# Patient Record
Sex: Female | Born: 1970 | Race: White | Hispanic: No | Marital: Married | Smoking: Never smoker
Health system: Southern US, Community
[De-identification: ages and names within clinical notes are randomized; demographics above are authoritative.]

## PROBLEM LIST (undated history)

## (undated) DIAGNOSIS — I10 Essential (primary) hypertension: Secondary | ICD-10-CM

## (undated) HISTORY — PX: BREAST LUMPECTOMY: SHX2

## (undated) HISTORY — PX: GANGLION CYST EXCISION: SHX1691

## (undated) HISTORY — PX: WRIST SURGERY: SHX841

## (undated) HISTORY — PX: BREAST SURGERY: SHX581

---

## 1997-08-12 ENCOUNTER — Other Ambulatory Visit: Admission: RE | Admit: 1997-08-12 | Discharge: 1997-08-12 | Payer: Self-pay | Admitting: Obstetrics & Gynecology

## 1997-09-09 ENCOUNTER — Other Ambulatory Visit: Admission: RE | Admit: 1997-09-09 | Discharge: 1997-09-09 | Payer: Self-pay | Admitting: Obstetrics & Gynecology

## 1997-09-14 ENCOUNTER — Inpatient Hospital Stay (HOSPITAL_COMMUNITY): Admission: AD | Admit: 1997-09-14 | Discharge: 1997-09-14 | Payer: Self-pay | Admitting: Obstetrics & Gynecology

## 1997-09-16 ENCOUNTER — Inpatient Hospital Stay (HOSPITAL_COMMUNITY): Admission: AD | Admit: 1997-09-16 | Discharge: 1997-09-16 | Payer: Self-pay | Admitting: Obstetrics & Gynecology

## 1997-09-17 ENCOUNTER — Inpatient Hospital Stay (HOSPITAL_COMMUNITY): Admission: AD | Admit: 1997-09-17 | Discharge: 1997-09-20 | Payer: Self-pay | Admitting: Obstetrics and Gynecology

## 1997-10-20 ENCOUNTER — Other Ambulatory Visit: Admission: RE | Admit: 1997-10-20 | Discharge: 1997-10-20 | Payer: Self-pay | Admitting: Obstetrics and Gynecology

## 1999-03-30 ENCOUNTER — Emergency Department (HOSPITAL_COMMUNITY): Admission: EM | Admit: 1999-03-30 | Discharge: 1999-03-30 | Payer: Self-pay | Admitting: Emergency Medicine

## 1999-04-23 ENCOUNTER — Emergency Department (HOSPITAL_COMMUNITY): Admission: EM | Admit: 1999-04-23 | Discharge: 1999-04-23 | Payer: Self-pay | Admitting: Emergency Medicine

## 2000-05-03 ENCOUNTER — Other Ambulatory Visit: Admission: RE | Admit: 2000-05-03 | Discharge: 2000-05-03 | Payer: Self-pay | Admitting: Obstetrics and Gynecology

## 2001-12-17 ENCOUNTER — Other Ambulatory Visit: Admission: RE | Admit: 2001-12-17 | Discharge: 2001-12-17 | Payer: Self-pay | Admitting: *Deleted

## 2003-01-15 ENCOUNTER — Other Ambulatory Visit: Admission: RE | Admit: 2003-01-15 | Discharge: 2003-01-15 | Payer: Self-pay | Admitting: Obstetrics and Gynecology

## 2004-02-25 ENCOUNTER — Other Ambulatory Visit: Admission: RE | Admit: 2004-02-25 | Discharge: 2004-02-25 | Payer: Self-pay | Admitting: Obstetrics and Gynecology

## 2005-05-14 ENCOUNTER — Other Ambulatory Visit: Admission: RE | Admit: 2005-05-14 | Discharge: 2005-05-14 | Payer: Self-pay | Admitting: Obstetrics and Gynecology

## 2006-06-03 ENCOUNTER — Other Ambulatory Visit: Admission: RE | Admit: 2006-06-03 | Discharge: 2006-06-03 | Payer: Self-pay | Admitting: Obstetrics and Gynecology

## 2008-09-30 ENCOUNTER — Ambulatory Visit (HOSPITAL_COMMUNITY): Admission: RE | Admit: 2008-09-30 | Discharge: 2008-09-30 | Payer: Self-pay | Admitting: Obstetrics and Gynecology

## 2008-09-30 ENCOUNTER — Encounter (INDEPENDENT_AMBULATORY_CARE_PROVIDER_SITE_OTHER): Payer: Self-pay | Admitting: Obstetrics and Gynecology

## 2009-09-27 ENCOUNTER — Encounter: Admission: RE | Admit: 2009-09-27 | Discharge: 2009-09-27 | Payer: Self-pay | Admitting: Obstetrics and Gynecology

## 2009-11-03 ENCOUNTER — Inpatient Hospital Stay (HOSPITAL_COMMUNITY): Admission: AD | Admit: 2009-11-03 | Discharge: 2009-11-04 | Payer: Self-pay | Admitting: Obstetrics & Gynecology

## 2009-11-06 ENCOUNTER — Inpatient Hospital Stay (HOSPITAL_COMMUNITY): Admission: AD | Admit: 2009-11-06 | Discharge: 2009-11-09 | Payer: Self-pay | Admitting: Obstetrics and Gynecology

## 2009-11-06 ENCOUNTER — Ambulatory Visit: Payer: Self-pay | Admitting: Nurse Practitioner

## 2010-07-08 LAB — COMPREHENSIVE METABOLIC PANEL
BUN: 11 mg/dL (ref 6–23)
Calcium: 9.3 mg/dL (ref 8.4–10.5)
Glucose, Bld: 73 mg/dL (ref 70–99)
Sodium: 135 mEq/L (ref 135–145)
Total Protein: 6.2 g/dL (ref 6.0–8.3)

## 2010-07-08 LAB — CBC
HCT: 40.8 % (ref 36.0–46.0)
Hemoglobin: 12.7 g/dL (ref 12.0–15.0)
MCH: 26.8 pg (ref 26.0–34.0)
MCH: 27.9 pg (ref 26.0–34.0)
MCHC: 32.5 g/dL (ref 30.0–36.0)
MCHC: 33.5 g/dL (ref 30.0–36.0)
MCHC: 34.4 g/dL (ref 30.0–36.0)
MCV: 81 fL (ref 78.0–100.0)
MCV: 81.3 fL (ref 78.0–100.0)
Platelets: 197 10*3/uL (ref 150–400)
Platelets: 206 10*3/uL (ref 150–400)
RDW: 15 % (ref 11.5–15.5)

## 2010-07-08 LAB — URINALYSIS, ROUTINE W REFLEX MICROSCOPIC
Nitrite: NEGATIVE
Specific Gravity, Urine: >1.03 — ABNORMAL HIGH (ref 1.005–1.030)
Urobilinogen, UA: 0.2 mg/dL (ref 0.0–1.0)

## 2010-07-08 LAB — GLUCOSE, RANDOM: Glucose, Bld: 50 mg/dL — ABNORMAL LOW (ref 70–99)

## 2010-07-08 LAB — URIC ACID: Uric Acid, Serum: 5.4 mg/dL (ref 2.4–7.0)

## 2010-07-08 LAB — URINE MICROSCOPIC-ADD ON

## 2010-07-08 LAB — LACTATE DEHYDROGENASE: LDH: 128 U/L (ref 94–250)

## 2010-07-08 LAB — GLUCOSE, CAPILLARY: Glucose-Capillary: 153 mg/dL — ABNORMAL HIGH (ref 70–99)

## 2010-07-09 LAB — COMPREHENSIVE METABOLIC PANEL
ALT: 17 U/L (ref 0–35)
Alkaline Phosphatase: 113 U/L (ref 39–117)
BUN: 11 mg/dL (ref 6–23)
CO2: 18 mEq/L — ABNORMAL LOW (ref 19–32)
GFR calc non Af Amer: >60 mL/min (ref >60)
Glucose, Bld: 53 mg/dL — ABNORMAL LOW (ref 70–99)
Potassium: 3.4 mEq/L — ABNORMAL LOW (ref 3.5–5.1)
Total Bilirubin: 0.3 mg/dL (ref 0.3–1.2)
Total Protein: 6.7 g/dL (ref 6.0–8.3)

## 2010-07-09 LAB — CBC
HCT: 41.1 % (ref 36.0–46.0)
Hemoglobin: 13.7 g/dL (ref 12.0–15.0)
MCV: 80.3 fL (ref 78.0–100.0)
RDW: 16.1 % — ABNORMAL HIGH (ref 11.5–15.5)
WBC: 10.7 10*3/uL — ABNORMAL HIGH (ref 4.0–10.5)

## 2010-07-09 LAB — PROTEIN, URINE, 24 HOUR: Urine Total Volume-UPROT: 2000 mL

## 2010-07-09 LAB — URIC ACID: Uric Acid, Serum: 5.8 mg/dL (ref 2.4–7.0)

## 2010-07-09 LAB — LACTATE DEHYDROGENASE: LDH: 143 U/L (ref 94–250)

## 2010-07-31 LAB — URINALYSIS, MICROSCOPIC ONLY
Bilirubin Urine: NEGATIVE
Ketones, ur: NEGATIVE mg/dL
Nitrite: NEGATIVE
Urobilinogen, UA: 0.2 mg/dL (ref 0.0–1.0)

## 2010-07-31 LAB — CBC
HCT: 39.6 % (ref 36.0–46.0)
MCHC: 34.5 g/dL (ref 30.0–36.0)
MCV: 88.8 fL (ref 78.0–100.0)
Platelets: 248 10*3/uL (ref 150–400)
RDW: 12.5 % (ref 11.5–15.5)

## 2010-07-31 LAB — ABO/RH: ABO/RH(D): O POS

## 2010-09-05 NOTE — Op Note (Signed)
Danielle Bernard, Danielle Bernard                ACCOUNT NO.:  [id number]   MEDICAL RECORD NO.:  [id number]          PATIENT TYPE:  AMB   LOCATION:  SDC                           FACILITY:  [facility]   PHYSICIAN:  Randye Lobo, M.D.   DATE OF BIRTH:  Aug 22, 1970   DATE OF PROCEDURE:  09/30/2008  DATE OF DISCHARGE:                               OPERATIVE REPORT   PREOPERATIVE DIAGNOSIS:  Missed abortion.   POSTOPERATIVE DIAGNOSIS:  Missed abortion.   PROCEDURE:  Dilation and evacuation.   SURGEON:  Randye Lobo, MD   ANESTHESIA:  MAC, paracervical block with 1% lidocaine.   ESTIMATED BLOOD LOSS:  500 mL.   Urine output by I and O catheterization prior to procedure.   COMPLICATIONS:  None.   INDICATIONS FOR PROCEDURE:  The patient is a 40 year old, gravida 3,  para 2-0-0-2, Caucasian female at 90 plus 5 weeks' gestation by last  menstrual period, who presented to the office for routine ultrasound on  September 29, 2008, at which time she was undergoing an ultrasound for first  trimester screening.  There was noted to have a missed abortion with a  fetus measuring 8 plus 6 weeks' gestation with no cardiac activity.  A  discussion was held with the patient regarding options for care and a  plan was made to proceed with a dilation and evacuation procedure after  risks, benefits, and alternatives were reviewed.   FINDINGS:  Exam under anesthesia revealed a 10-week size mid-position  uterus with no adnexal masses.   SPECIMEN:  Products of conception were sent to Pathology.   PROCEDURE:  The patient was re-identified in the preoperative hold area.  She did receive doxycycline 100 mg p.o. for antibiotic prophylaxis.   In the operating room, MAC anesthesia was induced and the patient was  placed in the dorsal lithotomy position.  The vagina, perineum, and  lower abdomen were sterilely prepped and draped.  The bladder was  catheterized of urine.   An exam under anesthesia was performed.  A speculum  was placed inside  the vagina and a single-tooth tenaculum was placed on the anterior  cervical lip.  A paracervical block was performed with a total of 10 mL  of 1% lidocaine.  The uterus was sounded to 10 cm.  The cervix was then  sequentially dilated to #29 [facility] dilator.  Initially an attempt was  made to pass a #10 suction tip curette, but this was too large and so  this was then switched to a #9 suction tip curette which was placed  without difficulty.  Proper suction was applied and the suction tip  curette was turned in a clockwise fashion as it was removed from within  the uterine cavity.  This was repeated in a couple of additional times  to remove any remaining tissue.  There was some brisk bleeding noted.  Sharp curettage was performed.  No remaining products of conception were  obtained.  The suction tip curette was replaced inside the uterine  cavity and proper suction applied to remove any remaining clots.  The  patient  was given Pitocin 20 units IV.  This did work well to create  improved hemostasis.  A ring forceps was also placed on the cervix and  just inside the endocervical canal.  This applied pressure in the  endocervix which also helped to create good hemostasis.  It was thought  that there was possibly a vessel in the top of the endocervix which was  contributing to some of the bleeding.   The patient was observed in the operating room and hemostasis continued  to be good and the procedure was therefore discontinued.   This concluded the patient's procedure.  There were no complications.  All needle, instrument, and sponge counts were correct.   The patient was escorted to the recovery room in stable and awake  condition.      Randye Lobo, M.D.  Electronically Signed     BES/MEDQ  D:  09/30/2008  T:  09/30/2008  Job:  [id number]

## 2011-01-10 ENCOUNTER — Other Ambulatory Visit: Payer: Self-pay | Admitting: Obstetrics and Gynecology

## 2011-12-23 ENCOUNTER — Encounter (HOSPITAL_COMMUNITY): Payer: Self-pay | Admitting: Emergency Medicine

## 2011-12-23 ENCOUNTER — Emergency Department (HOSPITAL_COMMUNITY)
Admission: EM | Admit: 2011-12-23 | Discharge: 2011-12-23 | Disposition: A | Discharge status: Home / Self Care | Payer: Self-pay | Attending: Emergency Medicine | Admitting: Emergency Medicine

## 2011-12-23 DIAGNOSIS — I10 Essential (primary) hypertension: Secondary | ICD-10-CM | POA: Insufficient documentation

## 2011-12-23 DIAGNOSIS — J029 Acute pharyngitis, unspecified: Secondary | ICD-10-CM

## 2011-12-23 HISTORY — DX: Essential (primary) hypertension: I10

## 2011-12-23 LAB — RAPID STREP SCREEN (MED CTR MEBANE ONLY): Streptococcus, Group A Screen (Direct): NEGATIVE

## 2011-12-23 NOTE — ED Provider Notes (Signed)
Medical screening examination/treatment/procedure(s) were performed by non-physician practitioner and as supervising physician I was immediately available for consultation/collaboration.   Zakir Henner L Merrilyn Legler, MD 12/23/11 2238 

## 2011-12-23 NOTE — ED Notes (Signed)
Pt c/o sore throat and congestion x2 days

## 2011-12-23 NOTE — ED Provider Notes (Signed)
History     CSN: [id number]  Arrival date & time 12/23/11  1614   First MD Initiated Contact with Patient 12/23/11 1726      Chief Complaint  Patient presents with  . Sore Throat  . Nasal Congestion    (Consider location/radiation/quality/duration/timing/severity/associated sxs/prior treatment) HPI Comments: Has mild NP cough.  Patient is a 41 y.o. female presenting with pharyngitis. The history is provided by the patient. No language interpreter was used.  Sore Throat This is a new problem. The current episode started yesterday. The problem occurs constantly. The problem has been unchanged. Associated symptoms include coughing and a sore throat. Pertinent negatives include no chills, fever, headaches, myalgias, nausea, rash, swollen glands or vomiting.    Past Medical History  Diagnosis Date  . Hypertension     Past Surgical History  Procedure Date  . Breast lumpectomy   . Wrist surgery     No family history on file.  History  Substance Use Topics  . Smoking status: Never Smoker   . Smokeless tobacco: Not on file  . Alcohol Use: No    OB History    Grav Para Term Preterm Abortions TAB SAB Ect Mult Living                  Review of Systems  Constitutional: Negative for fever and chills.  HENT: Positive for sore throat.   Respiratory: Positive for cough.   Gastrointestinal: Negative for nausea and vomiting.  Musculoskeletal: Negative for myalgias.  Skin: Negative for rash.  Neurological: Negative for headaches.  All other systems reviewed and are negative.    Allergies  Erythromycin  Home Medications  No current outpatient prescriptions on file.  BP 119/72  Pulse 77  Temp 98.4 F (36.9 C)  Resp 20  Ht 5\' 1"  (1.549 m)  Wt 187 lb (84.823 kg)  BMI 35.33 kg/m2  SpO2 100%  LMP 12/20/2011  Physical Exam  Nursing note and vitals reviewed. Constitutional: She is oriented to person, place, and time. She appears well-developed and well-nourished. No  distress.  HENT:  Head: Normocephalic and atraumatic.  Mouth/Throat: Uvula is midline, oropharynx is clear and moist and mucous membranes are normal. No uvula swelling. No oropharyngeal exudate, posterior oropharyngeal edema, posterior oropharyngeal erythema or tonsillar abscesses.  Eyes: EOM are normal.  Neck: Normal range of motion.  Cardiovascular: Normal rate, regular rhythm and normal heart sounds.   Pulmonary/Chest: Effort normal and breath sounds normal. No accessory muscle usage. Not tachypneic. No respiratory distress. She has no decreased breath sounds. She has no wheezes. She has no rhonchi. She has no rales.  Abdominal: Soft. She exhibits no distension. There is no tenderness.  Musculoskeletal: Normal range of motion.  Neurological: She is alert and oriented to person, place, and time.  Skin: Skin is warm and dry.  Psychiatric: She has a normal mood and affect. Judgment normal.    ED Course  Procedures (including critical care time)   Labs Reviewed  RAPID STREP SCREEN   No results found.   1. Pharyngitis       MDM  Neg strep screen Salt water gargles Chloraseptic Tylenol or ibuprofen F/u PCP        Evalina Field, PA 12/23/11 1830

## 2013-07-16 ENCOUNTER — Other Ambulatory Visit: Payer: Self-pay | Admitting: Obstetrics and Gynecology

## 2014-06-29 ENCOUNTER — Other Ambulatory Visit: Payer: Self-pay | Admitting: Family Medicine

## 2014-06-29 ENCOUNTER — Ambulatory Visit
Admission: RE | Admit: 2014-06-29 | Discharge: 2014-06-29 | Disposition: A | Discharge status: Home / Self Care | Payer: PRIVATE HEALTH INSURANCE | Source: Ambulatory Visit | Attending: Family Medicine | Admitting: Family Medicine

## 2014-06-29 DIAGNOSIS — M25562 Pain in left knee: Principal | ICD-10-CM

## 2014-06-29 DIAGNOSIS — M25561 Pain in right knee: Secondary | ICD-10-CM

## 2014-11-14 ENCOUNTER — Emergency Department (HOSPITAL_COMMUNITY)
Admission: EM | Admit: 2014-11-14 | Discharge: 2014-11-14 | Disposition: A | Discharge status: Home / Self Care | Payer: PRIVATE HEALTH INSURANCE | Attending: Emergency Medicine | Admitting: Emergency Medicine

## 2014-11-14 ENCOUNTER — Encounter (HOSPITAL_COMMUNITY): Payer: Self-pay | Admitting: Emergency Medicine

## 2014-11-14 DIAGNOSIS — M79672 Pain in left foot: Secondary | ICD-10-CM | POA: Diagnosis present

## 2014-11-14 DIAGNOSIS — Z7951 Long term (current) use of inhaled steroids: Secondary | ICD-10-CM | POA: Insufficient documentation

## 2014-11-14 DIAGNOSIS — T814XXA Infection following a procedure, initial encounter: Secondary | ICD-10-CM | POA: Insufficient documentation

## 2014-11-14 DIAGNOSIS — Y838 Other surgical procedures as the cause of abnormal reaction of the patient, or of later complication, without mention of misadventure at the time of the procedure: Secondary | ICD-10-CM | POA: Insufficient documentation

## 2014-11-14 DIAGNOSIS — I10 Essential (primary) hypertension: Secondary | ICD-10-CM | POA: Diagnosis not present

## 2014-11-14 DIAGNOSIS — T798XXA Other early complications of trauma, initial encounter: Secondary | ICD-10-CM

## 2014-11-14 MED ORDER — CLINDAMYCIN HCL 300 MG PO CAPS: 300.0000 mg | ORAL_CAPSULE | Freq: Three times a day (TID) | ORAL | Status: DC

## 2014-11-14 MED ORDER — CLINDAMYCIN HCL 150 MG PO CAPS
300.0000 mg | ORAL_CAPSULE | Freq: Once | ORAL | Status: AC
  Administered 2014-11-14: 300 mg via ORAL
  Filled 2014-11-14: qty 2

## 2014-11-14 MED ORDER — HYDROCODONE-ACETAMINOPHEN 5-325 MG PO TABS: 1.0000 | ORAL_TABLET | ORAL | Status: DC | PRN

## 2014-11-14 NOTE — ED Notes (Signed)
Pt made aware to return if symptoms worsen or if any life threatening symptoms occur.   
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
## 2014-11-14 NOTE — ED Notes (Addendum)
PT c/o pain to left foot after obtaining a laceration to bottom of left foot last Monday with staples still intact. PT states has follow up apt for 11/19/14. Staples intact to left foot with green drainage noted and swelling. PT stated completing an antibiotic that was prescribed from incident.

## 2014-11-14 NOTE — Discharge Instructions (Signed)
Soak foot in warm water 3 times a day, then rinse well, and cover with a light gauze bandage. Return here or see the doctor of your choice as needed for problems.   Wound Infection A wound infection happens when a type of germ (bacteria) starts growing in the wound. In some cases, this can cause the wound to break open. If cared for properly, the infected wound will heal from the inside to the outside. Wound infections need treatment. CAUSES An infection is caused by bacteria growing in the wound.  SYMPTOMS   Increase in redness, swelling, or pain at the wound site.  Increase in drainage at the wound site.  Wound or bandage (dressing) starts to smell bad.  Fever.  Feeling tired or fatigued.  Pus draining from the wound. TREATMENT  Your health care provider will prescribe antibiotic medicine. The wound infection should improve within 24 to 48 hours. Any redness around the wound should stop spreading and the wound should be less painful.  HOME CARE INSTRUCTIONS   Only take over-the-counter or prescription medicines for pain, discomfort, or fever as directed by your health care provider.  Take your antibiotics as directed. Finish them even if you start to feel better.  Gently wash the area with mild soap and water 2 times a day, or as directed. Rinse off the soap. Pat the area dry with a clean towel. Do not rub the wound. This may cause bleeding.  Follow your health care provider's instructions for how often you need to change the dressing.  Apply ointment and a dressing to the wound as directed.  If the dressing sticks, moisten it with soapy water and gently remove it.  Change the bandage right away if it becomes wet, dirty, or develops a bad smell.  Take showers. Do not take tub baths, swim, or do anything that may soak the wound until it is healed.  Avoid exercises that make you sweat heavily.  Use anti-itch medicine as directed by your health care provider. The wound may  itch when it is healing. Do not pick or scratch at the wound.  Follow up with your health care provider to get your wound rechecked as directed. SEEK MEDICAL CARE IF:  You have an increase in swelling, pain, or redness around the wound.  You have an increase in the amount of pus coming from the wound.  There is a bad smell coming from the wound.  More of the wound breaks open.  You have a fever. MAKE SURE YOU:   Understand these instructions.  Will watch your condition.  Will get help right away if you are not doing well or get worse. Document Released: 01/06/2003 Document Revised: 04/14/2013 Document Reviewed: 08/13/2010 [facility] Patient Information 2015 [place], [place]. This information is not intended to replace advice given to you by your health care provider. Make sure you discuss any questions you have with your health care provider.

## 2015-01-06 ENCOUNTER — Other Ambulatory Visit: Payer: Self-pay | Admitting: Obstetrics & Gynecology

## 2015-01-06 DIAGNOSIS — R928 Other abnormal and inconclusive findings on diagnostic imaging of breast: Secondary | ICD-10-CM

## 2015-01-07 ENCOUNTER — Ambulatory Visit
Admission: RE | Admit: 2015-01-07 | Discharge: 2015-01-07 | Disposition: A | Discharge status: Home / Self Care | Payer: PRIVATE HEALTH INSURANCE | Source: Ambulatory Visit | Attending: Obstetrics & Gynecology | Admitting: Obstetrics & Gynecology

## 2015-01-07 DIAGNOSIS — R928 Other abnormal and inconclusive findings on diagnostic imaging of breast: Secondary | ICD-10-CM

## 2015-08-12 ENCOUNTER — Other Ambulatory Visit: Payer: Self-pay | Admitting: Family Medicine

## 2015-08-12 DIAGNOSIS — R1084 Generalized abdominal pain: Secondary | ICD-10-CM

## 2015-08-15 ENCOUNTER — Ambulatory Visit
Admission: RE | Admit: 2015-08-15 | Discharge: 2015-08-15 | Disposition: A | Discharge status: Home / Self Care | Payer: PRIVATE HEALTH INSURANCE | Source: Ambulatory Visit | Attending: Family Medicine | Admitting: Family Medicine

## 2015-08-15 DIAGNOSIS — R1084 Generalized abdominal pain: Secondary | ICD-10-CM

## 2015-10-08 ENCOUNTER — Emergency Department (HOSPITAL_COMMUNITY)
Admission: EM | Admit: 2015-10-08 | Discharge: 2015-10-08 | Disposition: A | Discharge status: Home / Self Care | Payer: PRIVATE HEALTH INSURANCE | Attending: Emergency Medicine | Admitting: Emergency Medicine

## 2015-10-08 ENCOUNTER — Encounter (HOSPITAL_COMMUNITY): Payer: Self-pay | Admitting: Emergency Medicine

## 2015-10-08 DIAGNOSIS — I10 Essential (primary) hypertension: Secondary | ICD-10-CM | POA: Insufficient documentation

## 2015-10-08 DIAGNOSIS — R220 Localized swelling, mass and lump, head: Secondary | ICD-10-CM | POA: Diagnosis present

## 2015-10-08 DIAGNOSIS — B001 Herpesviral vesicular dermatitis: Secondary | ICD-10-CM

## 2015-10-08 DIAGNOSIS — B009 Herpesviral infection, unspecified: Secondary | ICD-10-CM | POA: Insufficient documentation

## 2015-10-08 DIAGNOSIS — Z791 Long term (current) use of non-steroidal anti-inflammatories (NSAID): Secondary | ICD-10-CM | POA: Insufficient documentation

## 2015-10-08 DIAGNOSIS — T148XXA Other injury of unspecified body region, initial encounter: Secondary | ICD-10-CM

## 2015-10-08 DIAGNOSIS — Z79899 Other long term (current) drug therapy: Secondary | ICD-10-CM | POA: Insufficient documentation

## 2015-10-08 DIAGNOSIS — L089 Local infection of the skin and subcutaneous tissue, unspecified: Secondary | ICD-10-CM

## 2015-10-08 MED ORDER — CEPHALEXIN 500 MG PO CAPS: 500.0000 mg | ORAL_CAPSULE | Freq: Four times a day (QID) | ORAL | Status: AC

## 2015-10-08 MED ORDER — VALACYCLOVIR HCL 1 G PO TABS: 2000.0000 mg | ORAL_TABLET | Freq: Two times a day (BID) | ORAL | Status: AC

## 2015-10-08 NOTE — ED Provider Notes (Signed)
CSN: [id number]     Arrival date & time 10/08/15  1753 History   First MD Initiated Contact with Patient 10/08/15 1818     Chief Complaint  Patient presents with  . Facial Swelling     (Consider location/radiation/quality/duration/timing/severity/associated sxs/prior Treatment) HPI  Danielle Bernard is a 45 y.o. female who presents for evaluation of sore on left upper lip which started several days ago, and worsened to include swelling of the lip, and beneath the left mandible. No fever, chills, nausea, vomiting, cough, shortness of breath, or dizziness. Last episode of cold sore was about 3 years ago. There are no other known modifying factors.    Past Medical History  Diagnosis Date  . Hypertension    Past Surgical History  Procedure Laterality Date  . Ganglion cyst excision    . Breast surgery      lump to right breast (beign)   Family History  Problem Relation Age of Onset  . Dementia Mother   . Diabetes Father   . Cancer Father    Social History  Substance Use Topics  . Smoking status: Never Smoker   . Smokeless tobacco: Never Used  . Alcohol Use: No   OB History    Gravida Para Term Preterm AB TAB SAB Ectopic Multiple Living   Review of Systems    Allergies  Erythromycin  Home Medications   Prior to Admission medications   Medication Sig Start Date End Date Taking? Authorizing Provider  acetaminophen (TYLENOL) 500 MG tablet Take 1,000 mg by mouth every 6 (six) hours as needed for mild pain.   Yes Historical Provider, MD  butalbital-acetaminophen-caffeine (FIORICET, ESGIC) 50-325-40 MG tablet Take 1 tablet by mouth every 4 (four) hours as needed for headache or migraine.  09/29/15  Yes Historical Provider, MD  ibuprofen (ADVIL,MOTRIN) 200 MG tablet Take 800 mg by mouth every 6 (six) hours as needed for moderate pain.   Yes Historical Provider, MD  metoprolol succinate (TOPROL-XL) 50 MG 24 hr tablet Take 1 tablet by mouth at bedtime.   11/01/14  Yes Historical Provider, MD  MONONESSA 0.25-35 MG-MCG tablet Take 1 tablet by mouth at bedtime.  09/09/14  Yes Historical Provider, MD  Pediatric Multivitamins-Iron (FLINTSTONES PLUS IRON PO) Take 1 tablet by mouth daily.   Yes Historical Provider, MD  Triamcinolone Acetonide (NASACORT ALLERGY 24HR NA) Place 1 spray into the nose daily.   Yes Historical Provider, MD  cephALEXin (KEFLEX) 500 MG capsule Take 1 capsule (500 mg total) by mouth 4 (four) times daily. 10/08/15   Mancel Bale, MD  valACYclovir (VALTREX) 1000 MG tablet Take 2 tablets (2,000 mg total) by mouth 2 (two) times daily. 10/08/15   Mancel Bale, MD   BP 136/73 mmHg  Pulse 69  Temp(Src) 98.3 F (36.8 C) (Oral)  Resp 18  Ht  (1.575 m)  Wt 179 lb (81.194 kg)  BMI 32.73 kg/m2  SpO2 100%  LMP 09/09/2015 Physical Exam  Constitutional: She is oriented to person, place, and time. She appears well-developed and well-nourished.  HENT:  Head: Normocephalic and atraumatic.  Right Ear: External ear normal.  Left Ear: External ear normal.  Red and indurated area, left upper lip, without fluctuance. Area is indistinct and does not have vesicles, or ulcerations. There is a small eschar over this area. There is no associated intraoral or dental abnormality. There is no trismus. There is  mild fullness beneath  the left mandible anteriorly, without palpable abscess, or significant adenopathy.  Eyes: Conjunctivae and EOM are normal. Pupils are equal, round, and reactive to light.  Neck: Normal range of motion and phonation normal. Neck supple.  Cardiovascular: Normal rate.   Pulmonary/Chest: Effort normal. She exhibits no bony tenderness.  Musculoskeletal: Normal range of motion.  Neurological: She is alert and oriented to person, place, and time. No cranial nerve deficit or sensory deficit. She exhibits normal muscle tone. Coordination normal.  Skin: Skin is warm, dry and intact.  Psychiatric: She has a normal mood and  affect. Her behavior is normal. Judgment and thought content normal.  Nursing note and vitals reviewed.   ED Course  Procedures (including critical care time)  Medications - No data to display  Patient Vitals for the past 24 hrs:  BP Temp Temp src Pulse Resp SpO2 Height Weight  10/08/15 1757 136/73 mmHg 98.3 F (36.8 C) Oral 69 18 100 % 5\' 2"  (1.575 m) 179 lb (81.194 kg)    At discharge- Reevaluation with update and discussion. After initial assessment and treatment, an updated evaluation reveals no further complaints, findings discussed with the patient and all questions were answered. [name]     Labs Review Labs Reviewed - No data to display  Imaging Review No results found. I have personally reviewed and evaluated these images and lab results as part of my medical decision-making.   EKG Interpretation None      MDM   Final diagnoses:  Wound infection (HCC)  Herpes simplex labialis    Evaluation consistent with a primary process of left upper lip herpes infection, with secondary bacterial infection.  Nursing Notes Reviewed/ Care Coordinated Applicable Imaging Reviewed Interpretation of Laboratory Data incorporated into ED treatment  The patient appears reasonably screened and/or stabilized for discharge and I doubt any other medical condition or other [facility] requiring further screening, evaluation, or treatment in the ED at this time prior to discharge.  Plan: Home Medications- Valtrex, Keflex; Home Treatments- warm compresses; return here if the recommended treatment, does not improve the symptoms; Recommended follow up- PCP 1 week for checkup   Mancel BaleElliott Arsenia Goracke, MD 10/08/15 2307

## 2015-10-08 NOTE — Discharge Instructions (Signed)
Use heat on the sore area 3 or 4 times a day.   Cellulitis Cellulitis is an infection of the skin and the tissue under the skin. The infected area is usually red and tender. This happens most often in the arms and lower legs. HOME CARE   Take your antibiotic medicine as told. Finish the medicine even if you start to feel better.  Keep the infected arm or leg raised (elevated).  Put a warm cloth on the area up to 4 times per day.  Only take medicines as told by your doctor.  Keep all doctor visits as told. GET HELP IF:  You see red streaks on the skin coming from the infected area.  Your red area gets bigger or turns a dark color.  Your bone or joint under the infected area is painful after the skin heals.  Your infection comes back in the same area or different area.  You have a puffy (swollen) bump in the infected area.  You have new symptoms.  You have a fever. GET HELP RIGHT AWAY IF:   You feel very sleepy.  You throw up (vomit) or have watery poop (diarrhea).  You feel sick and have muscle aches and pains.   This information is not intended to replace advice given to you by your health care provider. Make sure you discuss any questions you have with your health care provider.   Document Released: 09/26/2007 Document Revised: 12/29/2014 Document Reviewed: 06/25/2011 Elsevier Interactive Patient Education 2016 [employer]  Cold Sore A cold sore (fever blister) is a skin infection caused by the herpes simplex virus (HSV-1). HSV-1 is closely related to the virus that causes genital herpes (HSV-2), but they are not the same even though both viruses can cause oral and genital infections. Cold sores are small, fluid-filled sores inside of the mouth or on the lips, gums, nose, chin, cheeks, or fingers.  The herpes simplex virus can be easily passed (contagious) to other people through close personal contact, such as kissing or sharing personal items. The virus can also  spread to other parts of the body, such as the eyes or genitals. Cold sores are contagious until the sores crust over completely. They often heal within 2 weeks.  Once a person is infected, the herpes simplex virus remains permanently in the body. Therefore, there is no cure for cold sores, and they often recur when a person is tired, stressed, sick, or gets too much sun. Additional factors that can cause a recurrence include hormone changes in menstruation or pregnancy, certain drugs, and cold weather.  CAUSES  Cold sores are caused by the herpes simplex virus. The virus is spread from person to person through close contact, such as through kissing, touching the affected area, or sharing personal items such as lip balm, razors, or eating utensils.  SYMPTOMS  The first infection may not cause symptoms. If symptoms develop, the symptoms often go through different stages. Here is how a cold sore develops:   Tingling, itching, or burning is felt 1-2 days before the outbreak.   Fluid-filled blisters appear on the lips, inside the mouth, nose, or on the cheeks.   The blisters start to ooze clear fluid.   The blisters dry up and a yellow crust appears in its place.   The crust falls off.  Symptoms depend on whether it is the initial outbreak or a recurrence. Some other symptoms with the first outbreak may include:   Fever.   Sore throat.  Headache.   Muscle aches.   Swollen neck glands.  DIAGNOSIS  A diagnosis is often made based on your symptoms and looking at the sores. Sometimes, a sore may be swabbed and then examined in the lab to make a final diagnosis. If the sores are not present, blood tests can find the herpes simplex virus.  TREATMENT  There is no cure for cold sores and no vaccine for the herpes simplex virus. Within 2 weeks, most cold sores go away on their own without treatment. Medicines cannot make the infection go away, but medicine can help relieve some of the  pain associated with the sores, can work to stop the virus from multiplying, and can also shorten healing time. Medicine may be in the form of creams, gels, pills, or a shot.  HOME CARE INSTRUCTIONS   Only take over-the-counter or prescription medicines for pain, discomfort, or fever as directed by your caregiver. Do not use aspirin.   Use a cotton-tip swab to apply creams or gels to your sores.   Do not touch the sores or pick the scabs. Wash your hands often. Do not touch your eyes without washing your hands first.   Avoid kissing, oral sex, and sharing personal items until sores heal.   Apply an ice pack on your sores for 10-15 minutes to ease any discomfort.   Avoid hot, cold, or salty foods because they may hurt your mouth. Eat a soft, bland diet to avoid irritating the sores. Use a straw to drink if you have pain when drinking out of a glass.   Keep sores clean and dry to prevent an infection of other tissues.   Avoid the sun and limit stress if these things trigger outbreaks. If sun causes cold sores, apply sunscreen on the lips before being out in the sun.  SEEK MEDICAL CARE IF:   You have a fever or persistent symptoms for more than 2-3 days.   You have a fever and your symptoms suddenly get worse.   You have pus, not clear fluid, coming from the sores.   You have redness that is spreading.   You have pain or irritation in your eye.   You get sores on your genitals.   Your sores do not heal within 2 weeks.   You have a weakened immune system.   You have frequent recurrences of cold sores.  MAKE SURE YOU:   Understand these instructions.  Will watch your condition.  Will get help right away if you are not doing well or get worse.   This information is not intended to replace advice given to you by your health care provider. Make sure you discuss any questions you have with your health care provider.   Document Released: 04/06/2000 Document  Revised: 04/30/2014 Document Reviewed: 08/22/2011 Elsevier Interactive Patient Education [employer]

## 2015-10-08 NOTE — ED Notes (Signed)
Patient c/o swelling to left side of face. Upper lip has sore with notable swelling to left side. Patient states "I thought it was a cold sore and put some cream on it but it seems to be getting bigger." Patient now reports lymph node in jaw swelling, causing swelling to left side of face as well. Denies any fevers.

## 2016-02-23 ENCOUNTER — Other Ambulatory Visit: Payer: Self-pay | Admitting: Family Medicine

## 2016-02-23 DIAGNOSIS — M5412 Radiculopathy, cervical region: Secondary | ICD-10-CM

## 2016-03-06 ENCOUNTER — Ambulatory Visit
Admission: RE | Admit: 2016-03-06 | Discharge: 2016-03-06 | Disposition: A | Discharge status: Home / Self Care | Payer: PRIVATE HEALTH INSURANCE | Source: Ambulatory Visit | Attending: Family Medicine | Admitting: Family Medicine

## 2016-03-06 DIAGNOSIS — M5412 Radiculopathy, cervical region: Secondary | ICD-10-CM

## 2016-03-18 ENCOUNTER — Emergency Department (HOSPITAL_COMMUNITY)
Admission: EM | Admit: 2016-03-18 | Discharge: 2016-03-18 | Disposition: A | Discharge status: Home / Self Care | Payer: PRIVATE HEALTH INSURANCE | Attending: Emergency Medicine | Admitting: Emergency Medicine

## 2016-03-18 ENCOUNTER — Encounter (HOSPITAL_COMMUNITY): Payer: Self-pay | Admitting: Emergency Medicine

## 2016-03-18 DIAGNOSIS — J020 Streptococcal pharyngitis: Secondary | ICD-10-CM | POA: Diagnosis not present

## 2016-03-18 DIAGNOSIS — Z791 Long term (current) use of non-steroidal anti-inflammatories (NSAID): Secondary | ICD-10-CM | POA: Diagnosis not present

## 2016-03-18 DIAGNOSIS — J029 Acute pharyngitis, unspecified: Secondary | ICD-10-CM | POA: Diagnosis present

## 2016-03-18 DIAGNOSIS — I10 Essential (primary) hypertension: Secondary | ICD-10-CM | POA: Diagnosis not present

## 2016-03-18 DIAGNOSIS — Z79899 Other long term (current) drug therapy: Secondary | ICD-10-CM | POA: Diagnosis not present

## 2016-03-18 LAB — RAPID STREP SCREEN (MED CTR MEBANE ONLY): STREPTOCOCCUS, GROUP A SCREEN (DIRECT): POSITIVE — AB

## 2016-03-18 MED ORDER — PENICILLIN G BENZATHINE 1200000 UNIT/2ML IM SUSP
1.2000 10*6.[IU] | Freq: Once | INTRAMUSCULAR | Status: AC
  Administered 2016-03-18: 1.2 10*6.[IU] via INTRAMUSCULAR
  Filled 2016-03-18: qty 2

## 2016-03-18 NOTE — ED Notes (Signed)
ED Provider at bedside. 

## 2016-03-18 NOTE — ED Triage Notes (Signed)
Patient c/o sore throat with fevers, chills, and bilateral ear pain. Per patient started Friday night and is getting worse. Per patient took ibuprofen last at 10:30 with no relief.

## 2016-03-18 NOTE — Discharge Instructions (Signed)
Take over the counter tylenol and ibuprofen, as directed on packaging, as needed for discomfort.  Gargle with warm water several times per day to help with discomfort.  May also use over the counter sore throat pain medicines such as chloraseptic or sucrets, as directed on packaging, as needed for discomfort.  You were fully treated for "strep throat" with a "shot" of penicillin while you were in the Emergency Department. Call your regular medical doctor on Monday to schedule a follow up appointment this week.  Return to the Emergency Department immediately if worsening.

## 2016-03-18 NOTE — ED Provider Notes (Signed)
[facility]-EMER[facility]ENCY DEPT Provider Note   CSN: [id number] Arrival date & time: 03/18/16  1101     History   Chief Complaint Chief Complaint  Patient presents with  . Sore Throat    HPI Danielle Bernard is a 45 y.o. female.  HPI  Pt was seen at 1330.  Per pt, c/o gradual onset and persistence of constant sore throat and ears aching for the past 2 days.  Denies fevers, no rash, no CP/SOB, no cough, no N/V/D, no abd pain.    Past Medical History:  Diagnosis Date  . Hypertension     There are no active problems to display for this patient.   Past Surgical History:  Procedure Laterality Date  . BREAST SURGERY     lump to right breast (beign)  . GANGLION CYST EXCISION      OB History    Gravida Para Term Preterm AB Living   3 3 3     3    SAB TAB Ectopic Multiple Live Births                   Home Medications    Prior to Admission medications   Medication Sig Start Date End Date Taking? Authorizing Provider  acetaminophen (TYLENOL) 500 MG tablet Take 1,000 mg by mouth every 6 (six) hours as needed for mild pain.   Yes Historical Provider, MD  ibuprofen (ADVIL,MOTRIN) 200 MG tablet Take 800 mg by mouth every 6 (six) hours as needed for moderate pain.   Yes Historical Provider, MD  metoprolol succinate (TOPROL-XL) 50 MG 24 hr tablet Take 1 tablet by mouth at bedtime.  11/01/14  Yes Historical Provider, MD  MONONESSA 0.25-35 MG-MCG tablet Take 1 tablet by mouth at bedtime.  09/09/14  Yes Historical Provider, MD  Pediatric Multivitamins-Iron (FLINTSTONES PLUS IRON PO) Take 1 tablet by mouth daily.   Yes Historical Provider, MD  Triamcinolone Acetonide (NASACORT ALLERGY 24HR NA) Place 1 spray into the nose daily.   Yes Historical Provider, MD  cephALEXin (KEFLEX) 500 MG capsule Take 1 capsule (500 mg total) by mouth 4 (four) times daily. 10/08/15   Mancel BaleElliott Wentz, MD  valACYclovir (VALTREX) 1000 MG tablet Take 2 tablets (2,000 mg total) by mouth 2 (two) times daily. 10/08/15    Mancel BaleElliott Wentz, MD    Family History Family History  Problem Relation Age of Onset  . Dementia Mother   . Diabetes Father   . Cancer Father     Social History Social History  Substance Use Topics  . Smoking status: Never Smoker  . Smokeless tobacco: Never Used  . Alcohol use No     Allergies   Erythromycin   Review of Systems Review of Systems ROS: Statement: All systems negative except as marked or noted in the HPI; Constitutional: Negative for fever and chills. ; ; Eyes: Negative for eye pain, redness and discharge. ; ; ENMT: Negative for hoarseness, nasal congestion, sinus pressure and +ears pain, sore throat. ; ; Cardiovascular: Negative for chest pain, palpitations, diaphoresis, dyspnea and peripheral edema. ; ; Respiratory: Negative for cough, wheezing and stridor. ; ; Gastrointestinal: Negative for nausea, vomiting, diarrhea, abdominal pain, blood in stool, hematemesis, jaundice and rectal bleeding. . ; ; Genitourinary: Negative for dysuria, flank pain and hematuria. ; ; Musculoskeletal: Negative for back pain and neck pain. Negative for swelling and trauma.; ; Skin: Negative for pruritus, rash, abrasions, blisters, bruising and skin lesion.; ; Neuro: Negative for headache, lightheadedness and neck stiffness. Negative for weakness,  altered level of consciousness, altered mental status, extremity weakness, paresthesias, involuntary movement, seizure and syncope.      Physical Exam Updated Vital Signs BP 137/72   Pulse 102   Temp 99 F (37.2 C) (Temporal)   Resp 20   Ht 5\' 2"  (1.575 m)   Wt 195 lb (88.5 kg)   LMP 03/04/2016   SpO2 98%   BMI 35.67 kg/m   Physical Exam 1335: Physical examination:  Nursing notes reviewed; Vital signs and O2 SAT reviewed;  Constitutional: Well developed, Well nourished, Well hydrated, In no acute distress; Head:  Normocephalic, atraumatic; Eyes: EOMI, PERRL, No scleral icterus; ENMT: TM's clear bilat. +edemetous nasal turbinates bilat  with clear rhinorrhea. Mouth and pharynx without lesions. +posterior pharyngeal erythema, +tonsillar exudates. No intra-oral edema. No submandibular or sublingual edema. No hoarse voice, no drooling, no stridor. No pain with manipulation of larynx. No trismus. Mouth and pharynx normal, Mucous membranes moist; Neck: Supple, Full range of motion, No lymphadenopathy; Cardiovascular: Regular rate and rhythm, No gallop; Respiratory: Breath sounds clear & equal bilaterally, No wheezes.  Speaking full sentences with ease, Normal respiratory effort/excursion; Chest: Nontender, Movement normal; Abdomen: Soft, Nontender, Nondistended, Normal bowel sounds; Genitourinary: No CVA tenderness; Extremities: Pulses normal, No tenderness, No edema, No calf edema or asymmetry.; Neuro: AA&Ox3, Major CN grossly intact.  Speech clear. No gross focal motor or sensory deficits in extremities.; Skin: Color normal, Warm, Dry.   ED Treatments / Results  Labs (all labs ordered are listed, but only abnormal results are displayed)   EKG  EKG Interpretation None       Radiology   Procedures Procedures (including critical care time)  Medications Ordered in ED Medications  penicillin g benzathine (BICILLIN LA) 1200000 UNIT/2ML injection 1.2 Million Units (not administered)     Initial Impression / Assessment and Plan / ED Course  I have reviewed the triage vital signs and the nursing notes.  Pertinent labs & imaging results that were available during my care of the patient were reviewed by me and considered in my medical decision making (see chart for details).  MDM Reviewed: previous chart, nursing note and vitals Interpretation: labs   Results for orders placed or performed during the hospital encounter of 03/18/16  Rapid strep screen  Result Value Ref Range   Streptococcus, Group A Screen (Direct) POSITIVE (A) NEGATIVE    1340:  Tx IM PCN. Dx and testing d/w pt.  Questions answered.  Verb understanding,  agreeable to d/c home with outpt f/u.    Final Clinical Impressions(s) / ED Diagnoses   Final diagnoses:  None    New Prescriptions New Prescriptions   No medications on file     Samuel JesterKathleen Locke Barrell, DO 03/20/16 1643

## 2016-03-20 ENCOUNTER — Encounter (HOSPITAL_COMMUNITY): Payer: Self-pay | Admitting: Emergency Medicine

## 2016-05-25 ENCOUNTER — Ambulatory Visit
Admission: RE | Admit: 2016-05-25 | Discharge: 2016-05-25 | Disposition: A | Discharge status: Home / Self Care | Payer: 59 | Source: Ambulatory Visit | Attending: Family Medicine | Admitting: Family Medicine

## 2016-05-25 ENCOUNTER — Other Ambulatory Visit: Payer: Self-pay | Admitting: Family Medicine

## 2016-05-25 DIAGNOSIS — M79672 Pain in left foot: Secondary | ICD-10-CM

## 2016-08-03 ENCOUNTER — Other Ambulatory Visit: Payer: Self-pay | Admitting: Obstetrics

## 2016-08-06 LAB — CYTOLOGY - PAP

## 2017-07-18 IMAGING — MR MR CERVICAL SPINE W/O CM
4 of 5 series · 28 of 48 positions shown · non-contrast
Comparison: None.

CLINICAL DATA: 45 y/o F; cervical radiculopathy. Bilateral arm pain
and numbness.

EXAM:
MRI CERVICAL SPINE WITHOUT CONTRAST
TECHNIQUE: Multiplanar, multisequence MR imaging of the cervical spine was
performed. No intravenous contrast was administered.

[Series 3: tir sag · sagittal · 3.0mm · 0.41mm/px · 6 of 13 slices shown]
[im 1/13]
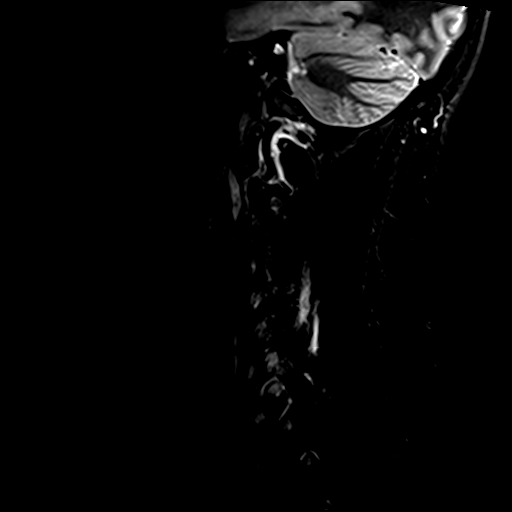
[im 3/13]
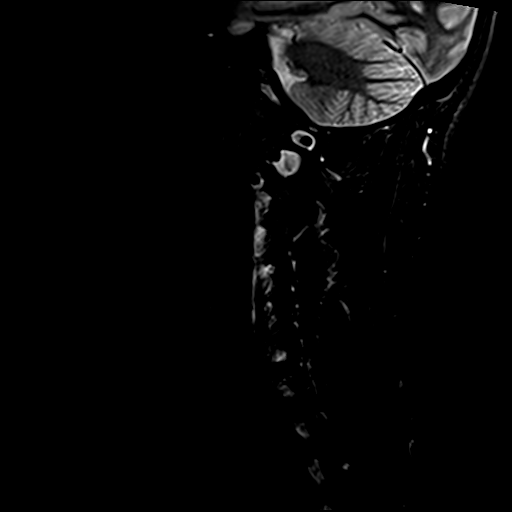
[im 5/13]
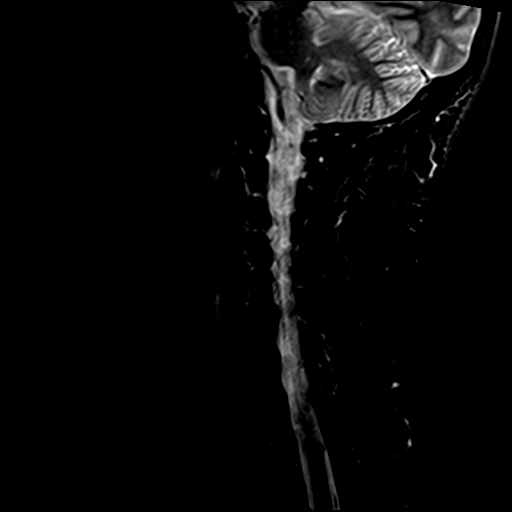
[im 8/13]
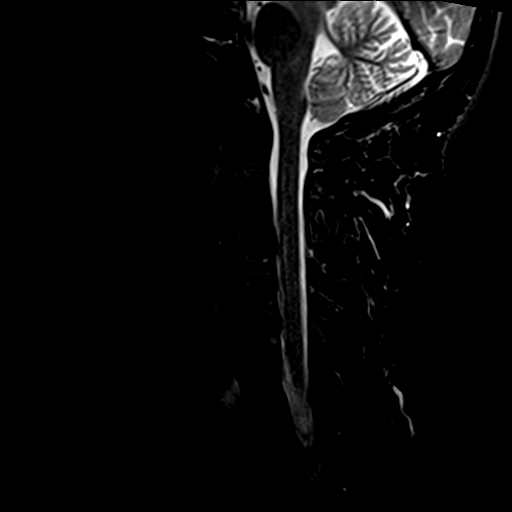
[im 10/13]
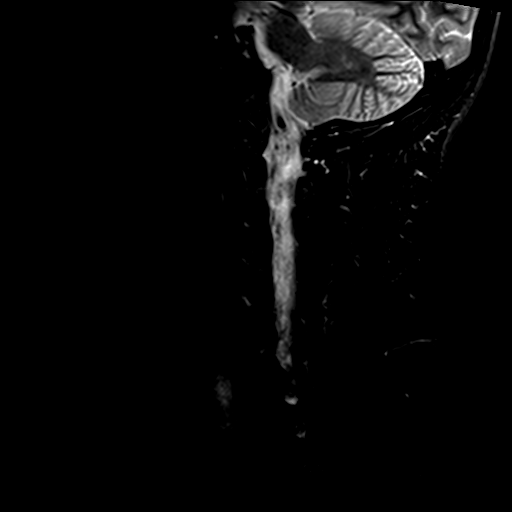
[im 13/13]
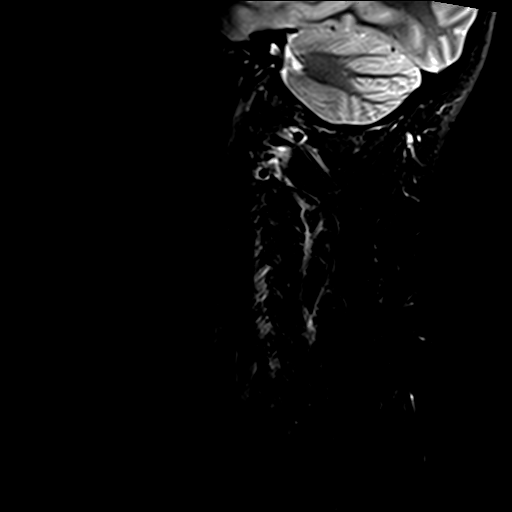

[Series 4: T1 · sagittal · 3.0mm · 0.41mm/px · 7 of 13 slices shown]
[im 1/13]
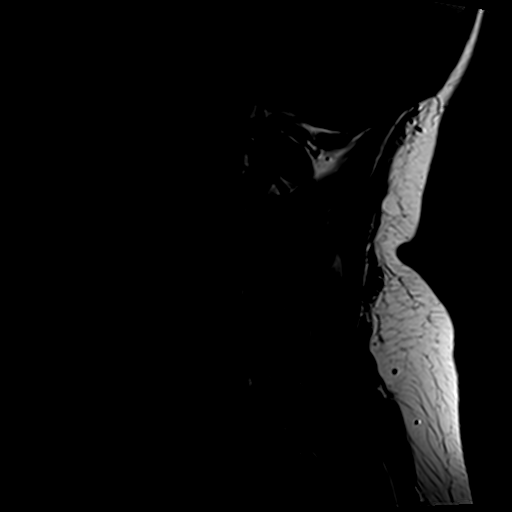
[im 3/13]
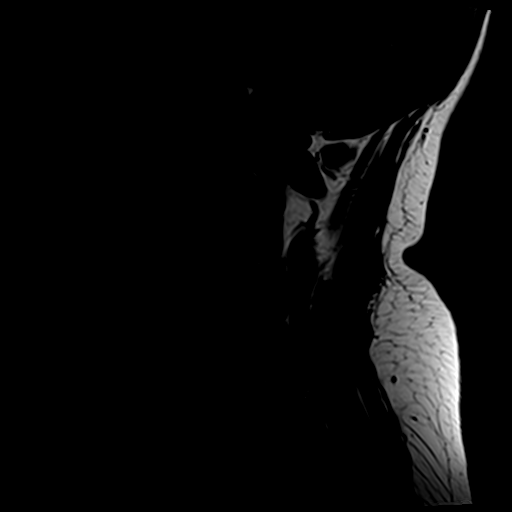
[im 5/13]
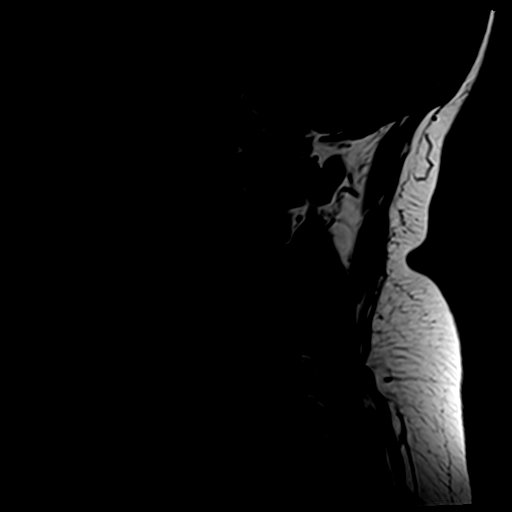
[im 7/13]
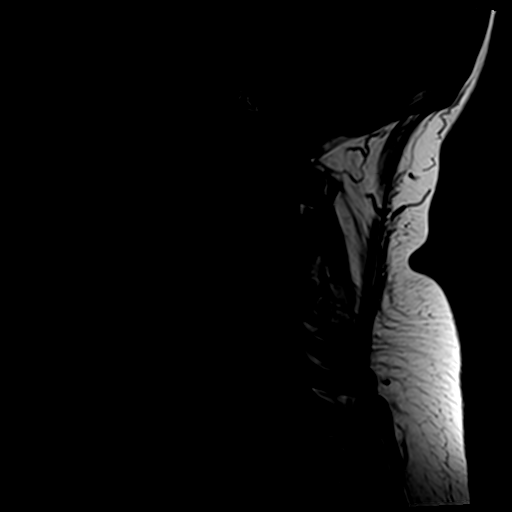
[im 9/13]
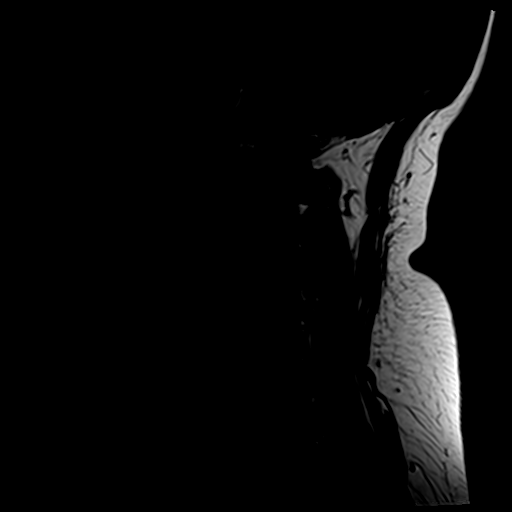
[im 11/13]
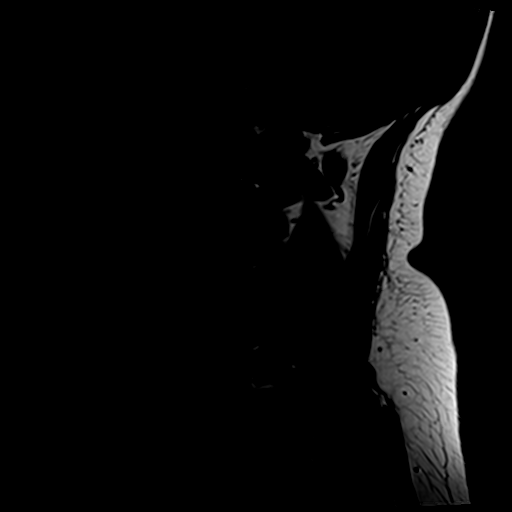
[im 13/13]
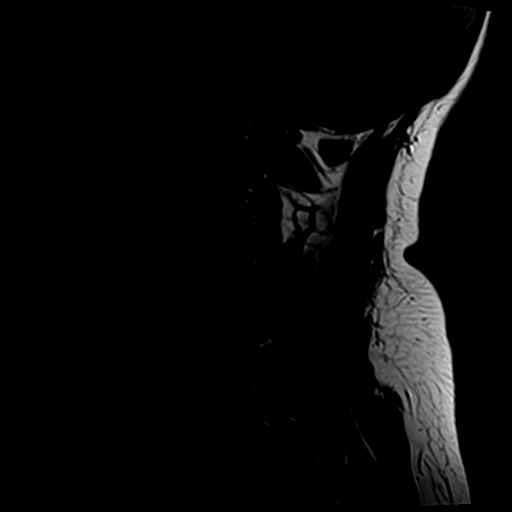

[Series 5: T2 · sagittal · 3.0mm · 0.66mm/px · 7 of 13 slices shown (1 of 2)]
[im 1/13]
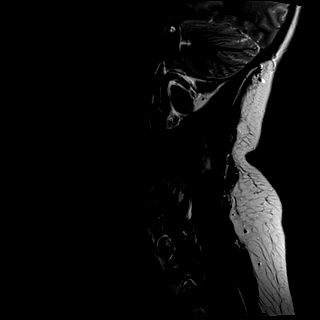
[im 3/13]
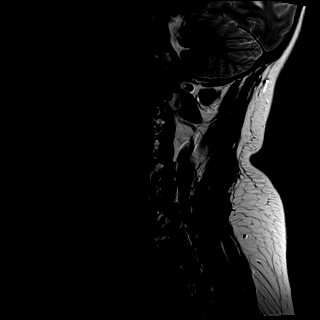
[im 5/13]
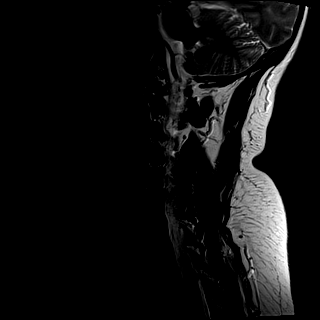
[im 7/13]
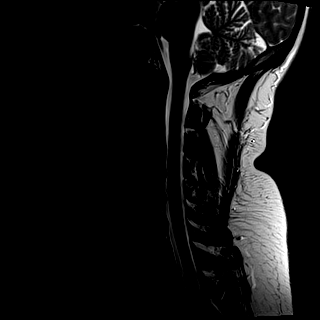
[im 9/13]
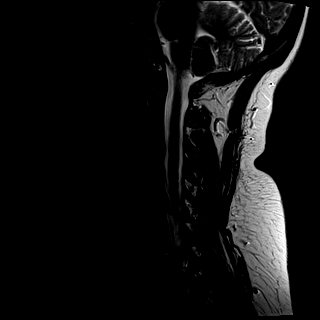
[im 11/13]
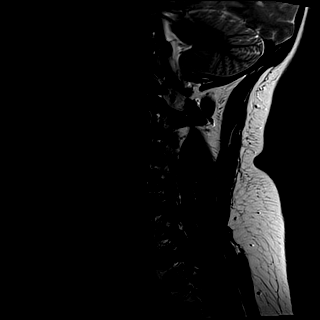
[im 13/13]
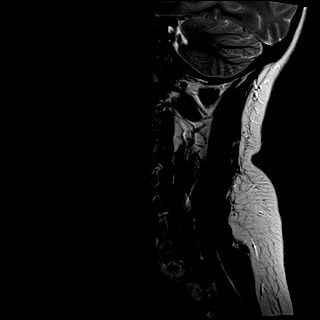

[Series 7: T2 · axial · 3.0mm · 0.70mm/px · z∈[-58,+42]mm · 8 of 28 slices shown (2 of 2)]
[im 1/28]
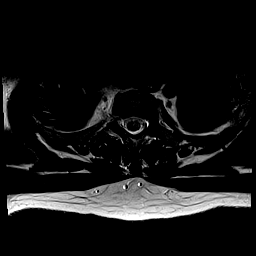
[im 5/28]
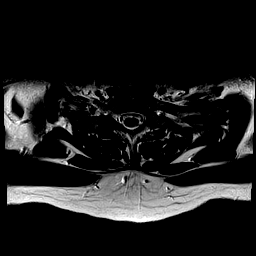
[im 9/28]
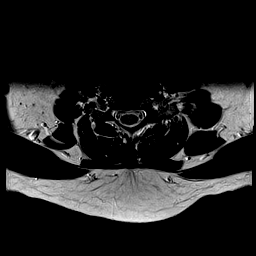
[im 13/28]
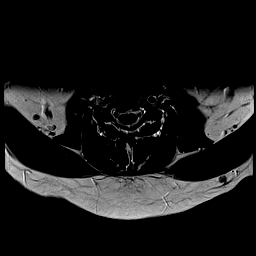
[im 15/28]
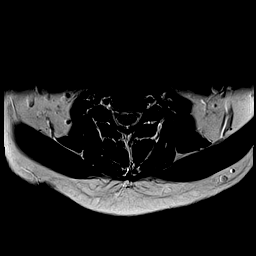
[im 19/28]
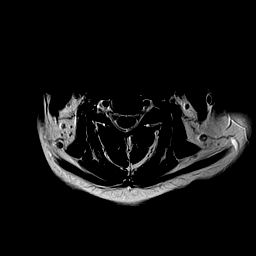
[im 23/28]
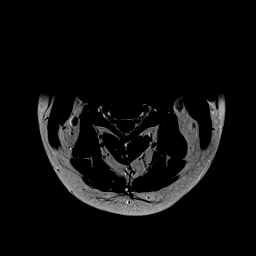
[im 28/28]
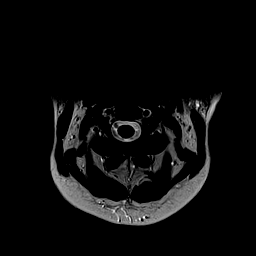

[28 of 48 positions shown; findings below may reference images not displayed]

FINDINGS: Alignment: Straightening of cervical lordosis.  No listhesis.

Vertebrae: No fracture, evidence of discitis, or bone lesion.

Cord: Normal signal and morphology.

Posterior Fossa, vertebral arteries, paraspinal tissues: Negative.

Disc levels:

C2-3: No significant disc displacement, foraminal narrowing, or
canal stenosis.

C3-4: Small central protrusion in contact with the anterior cord
with mild cord flattening. No significant foraminal narrowing or
canal stenosis.

C4-5: Small disc bulge eccentric to the left with effacement of
anterior thecal sac. No significant foraminal narrowing or canal
stenosis.

C5-6: Small disc osteophyte complex and right subarticular disc
protrusion as well as left-sided uncovertebral and facet
hypertrophy. Mild left foraminal narrowing. Effacement of anterior
thecal sac with slight anterior cord flattening.

C6-7: Minimal central protrusion. No significant foraminal narrowing
or canal stenosis.

C7-T1: No significant disc displacement, foraminal narrowing, or
canal stenosis.
IMPRESSION: 1. Mild cervical spondylosis without high-grade canal stenosis or
foraminal narrowing.
2. Small disc bulges/protrusions at the C3-4, C4-5, and C5-6 levels
in contact with the anterior cord with slight cord flattening.

By: Jhoana Ducharme M.D.

## 2017-10-06 IMAGING — CR DG FOOT COMPLETE 3+V*L*
3 series · 3 of 3 positions shown · non-contrast
Comparison: No recent prior .

CLINICAL DATA: Swelling and pain.

EXAM:
LEFT FOOT - COMPLETE 3+ VIEW

[x foot ap left]
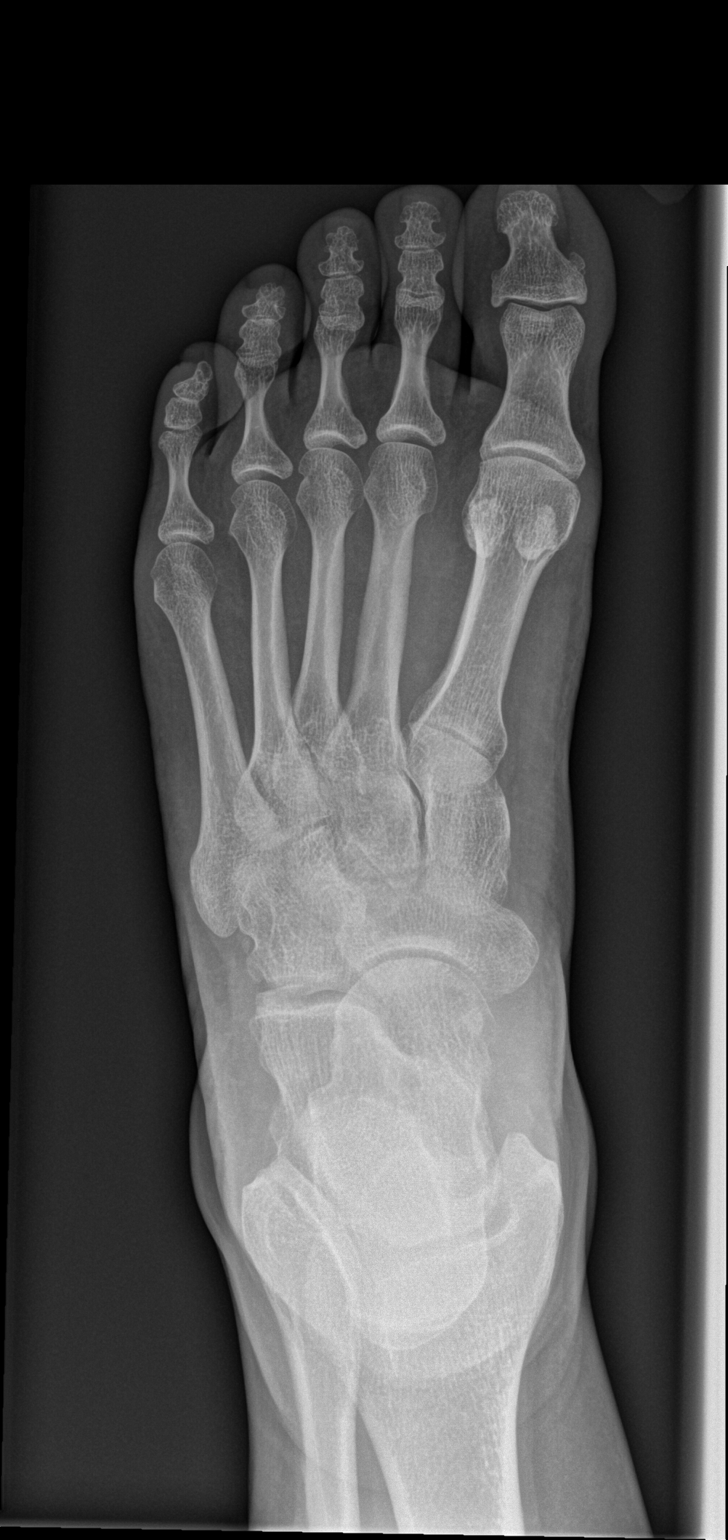

[x foot obl left]
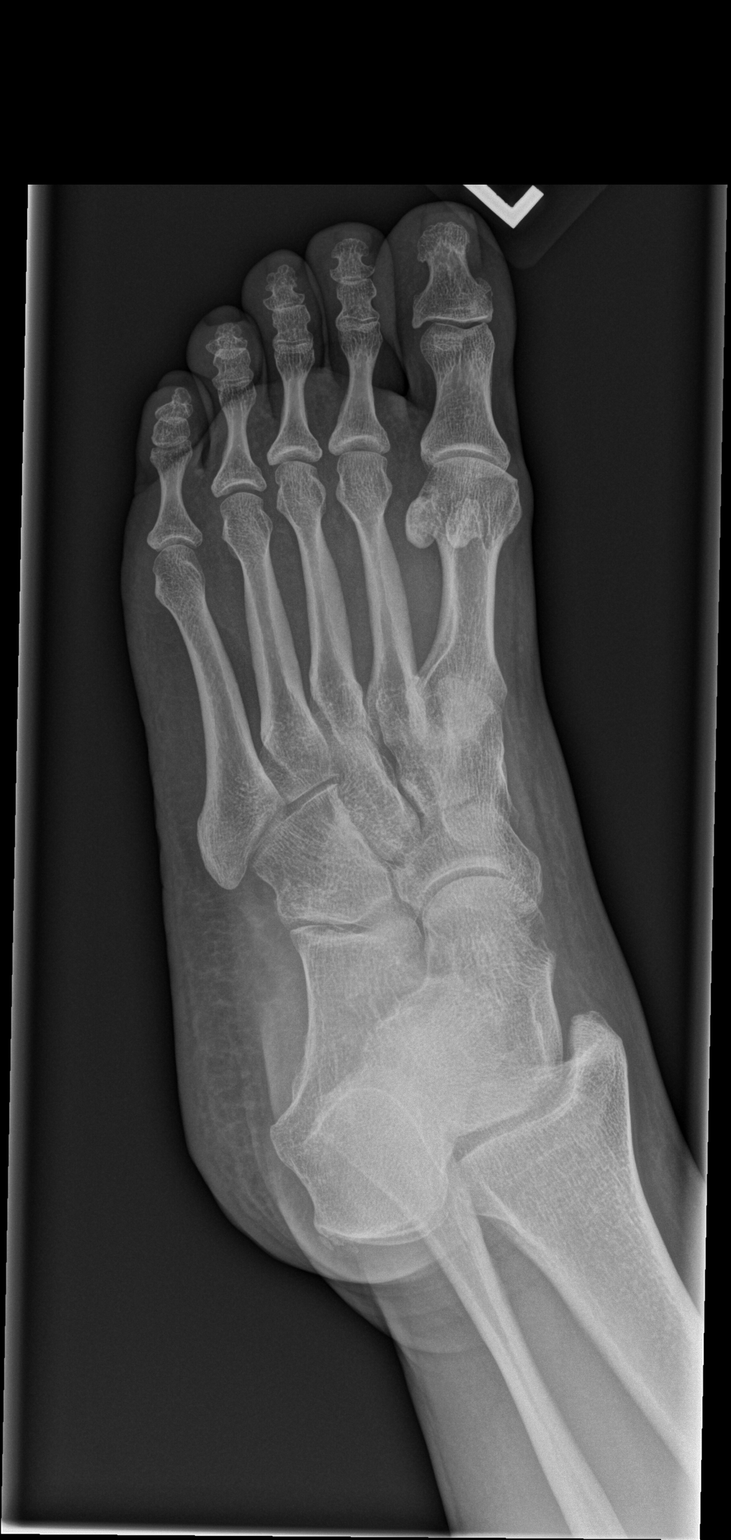

[x foot lat left]
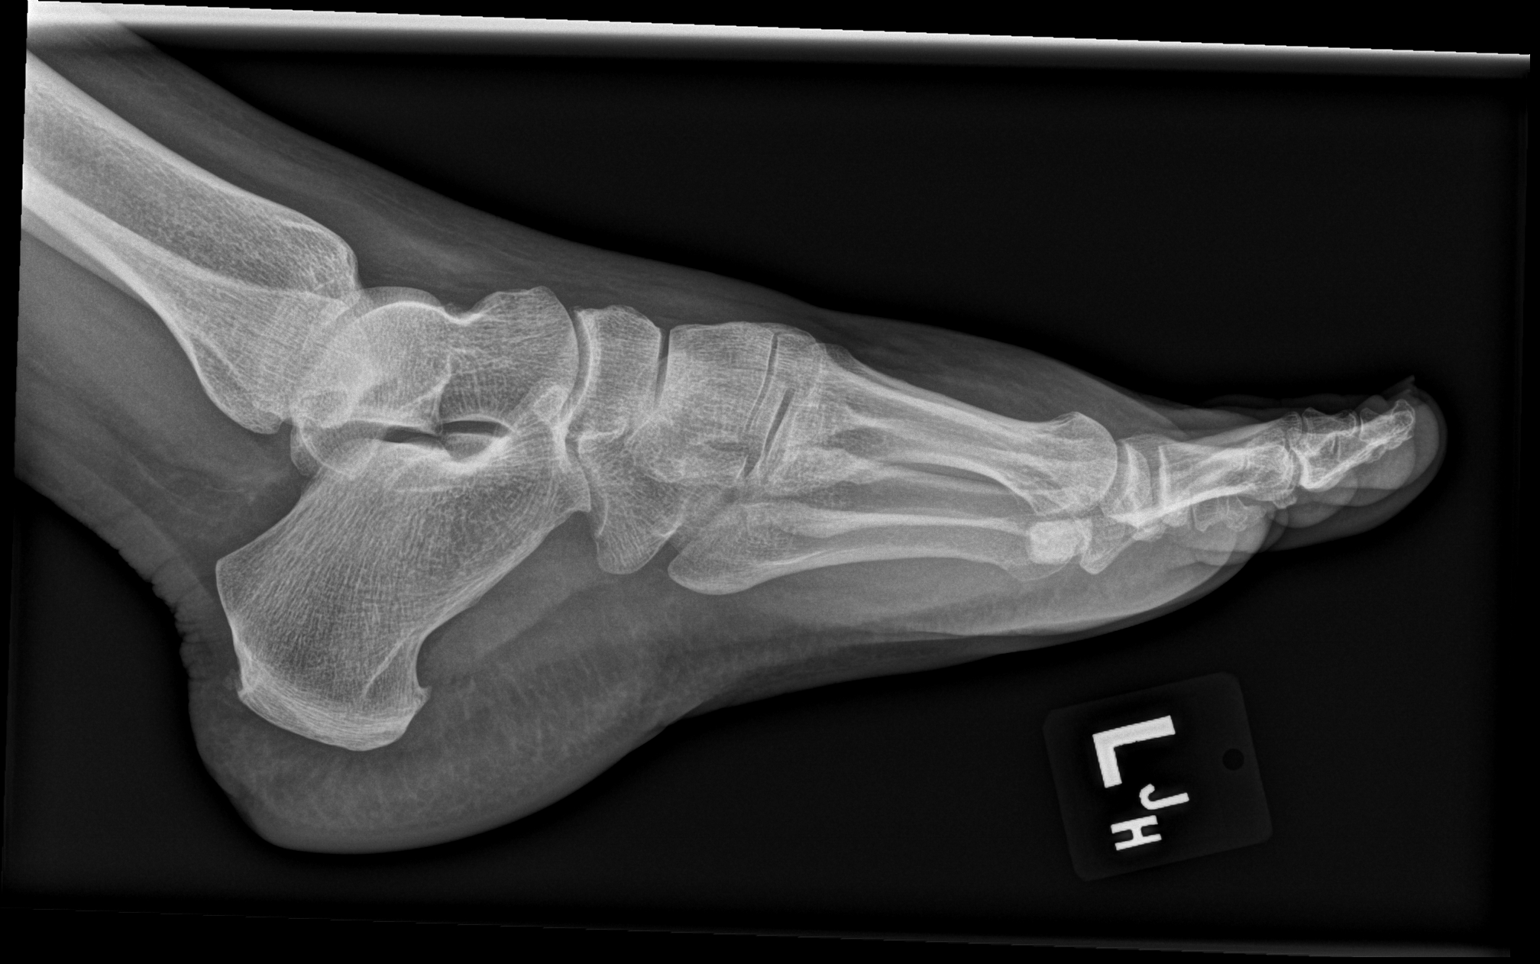

[3 of 3 positions shown; findings below may reference images not displayed]

FINDINGS: Diffuse mild soft tissue swelling. No radiopaque foreign body. No
acute or focal bony abnormality. Mild degenerative changes first
metatarsophalangeal joint.
IMPRESSION: Mild diffuse soft tissue swelling. No acute or focal bony
abnormality .
